# Patient Record
Sex: Male | Born: 2005 | Race: Black or African American | Hispanic: No | Marital: Single | Smoking: Never smoker
Health system: Southern US, Community
[De-identification: ages and names within clinical notes are randomized; demographics above are authoritative.]

## PROBLEM LIST (undated history)

## (undated) DIAGNOSIS — L309 Dermatitis, unspecified: Secondary | ICD-10-CM

## (undated) DIAGNOSIS — F909 Attention-deficit hyperactivity disorder, unspecified type: Secondary | ICD-10-CM

## (undated) DIAGNOSIS — L709 Acne, unspecified: Secondary | ICD-10-CM

## (undated) HISTORY — DX: Dermatitis, unspecified: L30.9

## (undated) HISTORY — DX: Acne, unspecified: L70.9

## (undated) HISTORY — DX: Attention-deficit hyperactivity disorder, unspecified type: F90.9

---
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
## 2023-02-26 ENCOUNTER — Encounter (INDEPENDENT_AMBULATORY_CARE_PROVIDER_SITE_OTHER): Payer: Self-pay

## 2023-02-27 ENCOUNTER — Encounter (INDEPENDENT_AMBULATORY_CARE_PROVIDER_SITE_OTHER): Payer: Self-pay | Admitting: Emergency Medicine

## 2023-02-27 ENCOUNTER — Encounter (INDEPENDENT_AMBULATORY_CARE_PROVIDER_SITE_OTHER): Payer: Self-pay

## 2023-02-27 ENCOUNTER — Encounter (INDEPENDENT_AMBULATORY_CARE_PROVIDER_SITE_OTHER): Payer: 59 | Admitting: Emergency Medicine

## 2023-02-27 NOTE — Progress Notes (Signed)
Have you seen any specialist/other providers since your last visit with [place]?  No     Arm preference verified?  Yes     Health Maintenance Due   Topic Date Due    HEPATITIS C SCREENING  Never done    INFLUENZA VACCINE  12/13/2022    COVID-19 Vaccine (3 - 2023-24 season) 01/13/2023

## 2023-02-27 NOTE — Progress Notes (Signed)
Subjective:      Date: 02/27/2023 8:08 AM   Patient ID: Ivan Smith is a 17 y.o. male.    Chief Complaint:  Chief Complaint   Patient presents with    Annual Exam     fasting    ADHD       HPI  Visit Type: Health Maintenance Visit  Work Status:   Reported Health:   Reported Diet:   Reported Exercise:   Dental:   Vision:   Hearing:   Immunization Status:   Reproductive Health:   Prior Screening Tests:   General Health Risks:   Safety Elements Used:         ADHD:  -06/02/20-- Patient states he has not been taking the Adderall consistently.  The last time he took any was in the summer 2021.  Patient states he forgot about taking it and so he kind of "fell off track."  His grades have gotten worse and he is interested in restarting the medication.  He is now back in full-time school.  He has an IEP.  Denies chest pain or palpitations.  - 07/04/20-- Patient states things have been going well for him.  He restarted the Adderall about 1 month ago and has been taking it consistently.  He is taking it on school days and occasionally on the weekends if he has work to do.  He last took the medication this morning.  He is doing much better this semester in school.  He currently reports having a's and B's.  Denies any symptoms or side effects.  -02/27/23--      Problem List:  Problem List[1]    Current Medications:  Medications Taking[2]    Allergies:  Allergies[3]    Past Medical History:  Medical History[4]    Past Surgical History:  Past Surgical History[5]    Family History:  Family History[6]    Social History:  Social History[7]     The following sections were reviewed this encounter by the provider:        ROS:   Review of Systems   Objective:     Vitals:  There were no vitals taken for this visit.    Examination:   Physical Exam     Assessment/Plan:       There are no diagnoses linked to this encounter.    No problems updated.     Health Maintenance:      HEALTH MAINTENANCE:      Counseling/Anticipatory Guidance:   Nutrition, physical activity, healthy weight, injury prevention, misuse of tobacco, alcohol and drugs, sexual behavior and STDs, contraception, dental health, mental health, immunizations, screenings.      Immunizations:  Tdap or TD: UTD  Pneumonia vaccine: age 103+  Zostavax: age 64+  Flu vaccine: UTD  Covid vaccine: UTD, Declined, Needs     Screening:   Colonoscopy: age 35+  BreastMammogram: age 10+  Cervical/Pap smears: UTD  Bone density/DEXA: 65+     Health Habits:   Exercise: discussed  Nutrition: discussed     Risks and benefits of medical care (including but not limited to vaccinations, medications, blood tests, imaging, offsite referrals, etc.) discussed in detail with the patient. All questions and concerns were satisfactorily addressed as per the patient. The patient acknowledges and understands the treatment plan discussed during today's visit and if the patient has any adverse outcome or worsening condition, the patient will follow up immediately in the clinic or at an ER/Urgent care facility.  This note was dictated using voice recognition speech to text software in may contain inadvertent recognition errors.    No follow-ups on file.    Forde Radon, MD       [1]   Patient Active Problem List  Diagnosis    Allergic rhinitis    Atopic dermatitis    Attention deficit hyperactivity disorder (ADHD), predominantly inattentive type    Underimmunized    Vaccine refused by parent    Acne    Encounter for well child visit at 78 years of age    Obesity due to excess calories without serious comorbidity with body mass index (BMI) in 95th to 98th percentile for age in pediatric patient    Elevated blood pressure reading    Asthma   [2]   Outpatient Medications Marked as Taking for the 02/27/23 encounter (Office Visit) with Forde Radon, MD   Medication Sig Dispense Refill    albuterol sulfate HFA (PROVENTIL) 108 (90 Base) MCG/ACT inhaler Inhale 1 puff into the lungs 4 (four) times daily       amphetamine-dextroamphetamine (ADDERALL XR) 15 MG 24 hr capsule Take 1 capsule (15 mg total) by mouth every morning 30 capsule 0    triamcinolone (KENALOG) 0.1 % cream Apply topically     [3]   Allergies  Allergen Reactions    Amoxicillin-Pot Clavulanate Rash    Strawberry Extract Rash   [4]   Past Medical History:  Diagnosis Date    ADHD     Eczema    [5] History reviewed. No pertinent surgical history.  [6]   Family History  Problem Relation Name Age of Onset    No known problems Mother      No known problems Father     [7]   Social History  Socioeconomic History    Marital status: Single   Tobacco Use    Smoking status: Never    Smokeless tobacco: Never   Vaping Use    Vaping status: Never Used   Substance and Sexual Activity    Alcohol use: Never    Drug use: Never    Sexual activity: Never

## 2023-03-18 ENCOUNTER — Encounter (INDEPENDENT_AMBULATORY_CARE_PROVIDER_SITE_OTHER): Payer: Self-pay

## 2023-03-18 ENCOUNTER — Ambulatory Visit (INDEPENDENT_AMBULATORY_CARE_PROVIDER_SITE_OTHER): Payer: 59

## 2023-03-18 VITALS — BP 134/85 | HR 81 | Temp 97.6°F | Ht 69.0 in | Wt 160.0 lb

## 2023-03-18 DIAGNOSIS — L2084 Intrinsic (allergic) eczema: Secondary | ICD-10-CM

## 2023-03-18 MED ORDER — PREDNISONE 20 MG PO TABS
ORAL_TABLET | ORAL | 0 refills | Status: AC
Start: 2023-03-18 — End: 2023-04-02

## 2023-03-18 NOTE — Progress Notes (Signed)
 [facility]  URGENT  CARE  PROGRESS NOTE     Patient: Ivan Smith   Date: 03/18/2023   MRN: [id number]       Karis Emig is a 17 y.o. male      HISTORY     History obtained from: Patient    Chief Complaint   Patient presents with    Rash     Rash on enti

## 2023-03-18 NOTE — Patient Instructions (Addendum)
 You were seen here today for symptoms consistent with a flare of your atopic dermatitis.  I have sent a prednisone taper to the pharmacy for you, please take as directed and call your dermatologist for Ivan Smith in the morning for a follow-up appointment.  In
